# Patient Record
Sex: Female | Born: 2005 | Race: Black or African American | Hispanic: No | Marital: Single | Smoking: Never smoker
Health system: Southern US, Community
[De-identification: ages and names within clinical notes are randomized; demographics above are authoritative.]

## PROBLEM LIST (undated history)

## (undated) DIAGNOSIS — J309 Allergic rhinitis, unspecified: Secondary | ICD-10-CM

## (undated) DIAGNOSIS — E669 Obesity, unspecified: Secondary | ICD-10-CM

## (undated) DIAGNOSIS — J302 Other seasonal allergic rhinitis: Secondary | ICD-10-CM

## (undated) HISTORY — DX: Allergic rhinitis, unspecified: J30.9

## (undated) HISTORY — DX: Obesity, unspecified: E66.9

---
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
## 2024-01-16 ENCOUNTER — Encounter: Payer: Self-pay | Admitting: *Deleted
